# Patient Record
Sex: Female | Born: 1985 | Race: White | Hispanic: No | Marital: Single | Smoking: Never smoker
Health system: Southern US, Community
[De-identification: ages and names within clinical notes are randomized; demographics above are authoritative.]

---

## 2007-12-05 ENCOUNTER — Inpatient Hospital Stay (HOSPITAL_COMMUNITY): Admission: AD | Admit: 2007-12-05 | Discharge: 2007-12-06 | Payer: Self-pay | Admitting: Family Medicine

## 2008-12-14 IMAGING — US US TRANSVAGINAL NON-OB
1 series · 14 of 25 positions shown · non-contrast
Comparison: none

CLINICAL DATA: Possible pregnancy.  Last menstrual period 11/12/07.
 TRANSABDOMINAL AND TRANSVAGINAL PELVIC ULTRASOUND ? 12/06/07:
TECHNIQUE: Both transabdominal and transvaginal ultrasound examinations of the pelvis were performed including evaluation of the uterus, ovaries, adnexal regions, and pelvic cul-de-sac.

[Series 1: us transvaginal non-ob · 0.28mm/px · 14 of 35 slices shown]
[im 1/35]
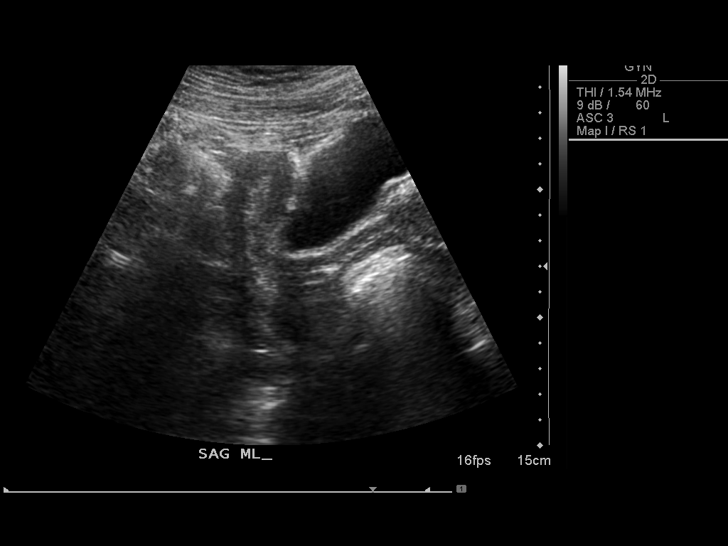
[im 3/35]
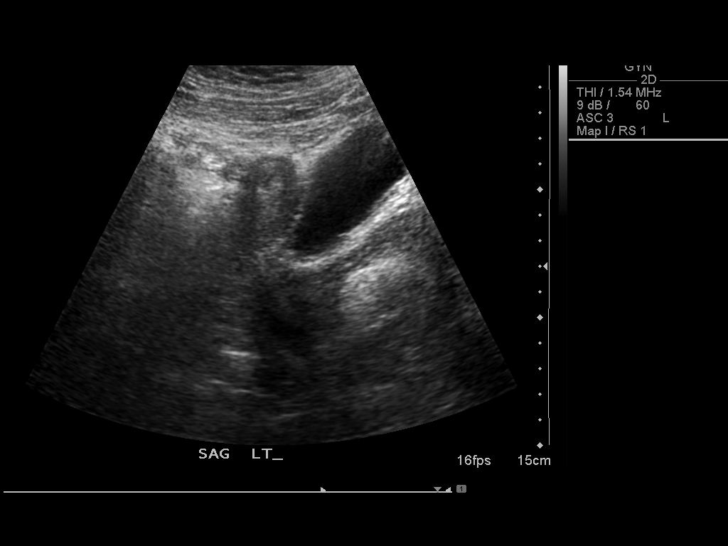
[im 6/35]
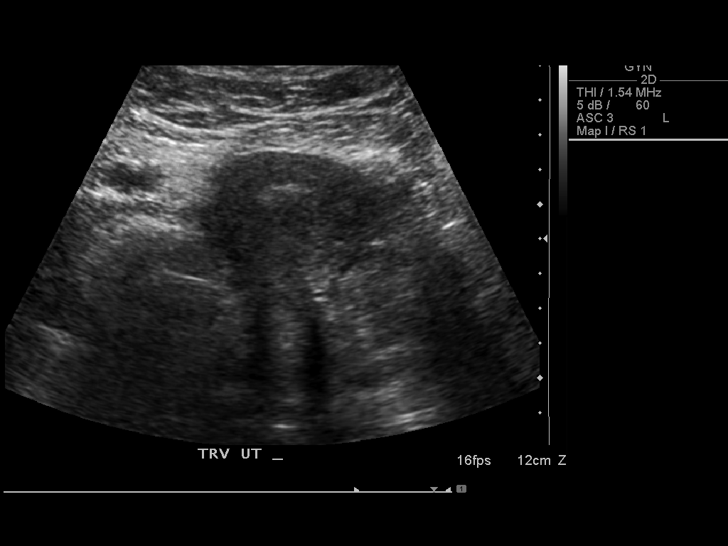
[im 9/35]
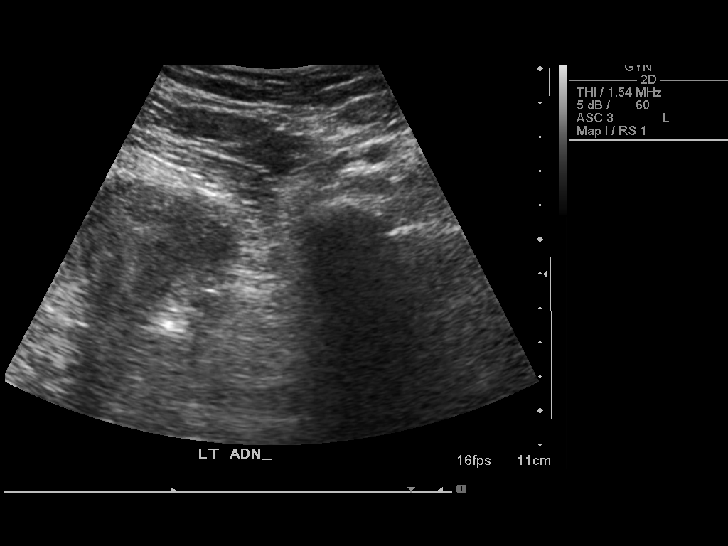
[im 12/35]
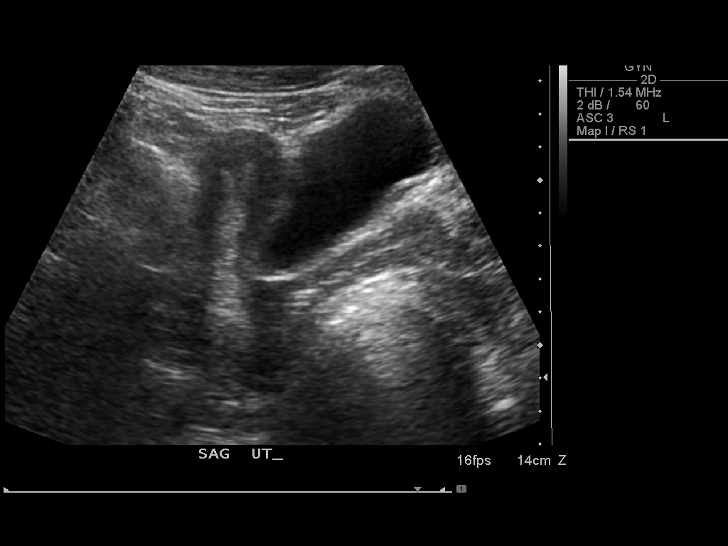
[im 13/35]
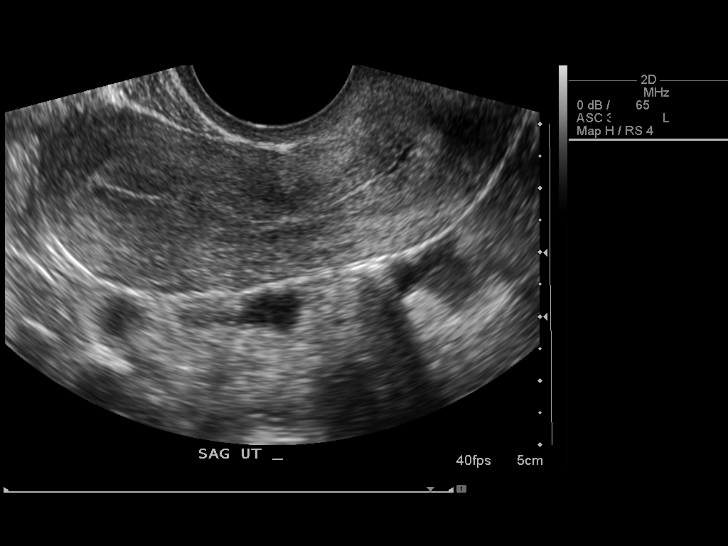
[im 16/35]
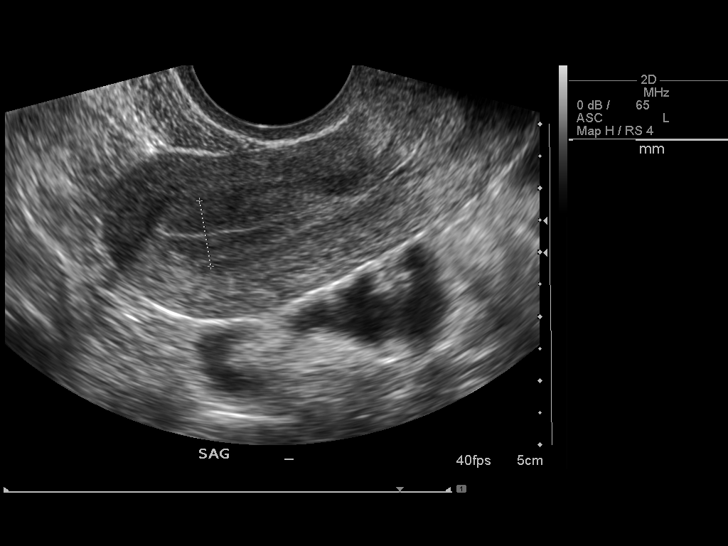
[im 19/35]
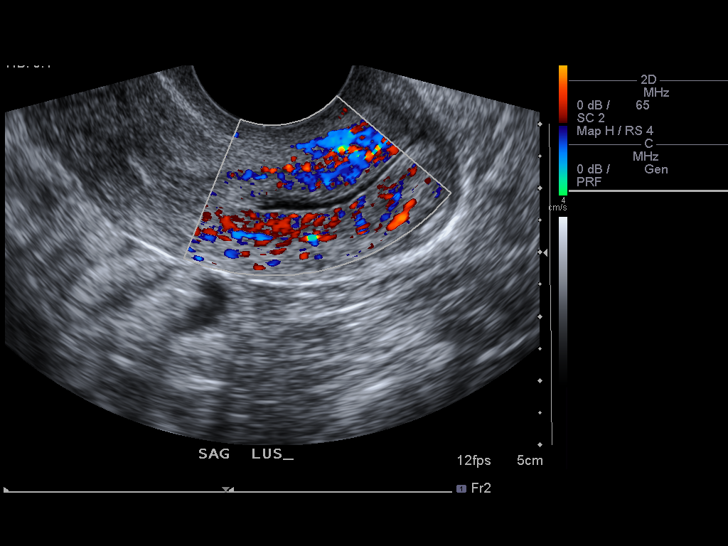
[im 22/35]
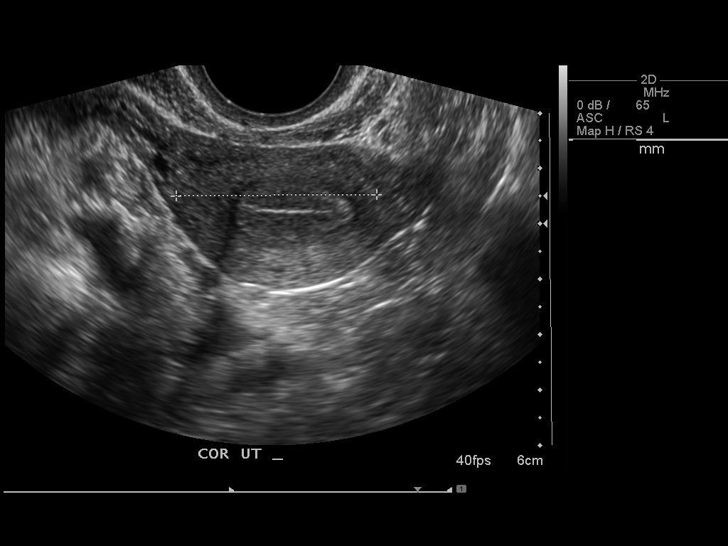
[im 23/35]
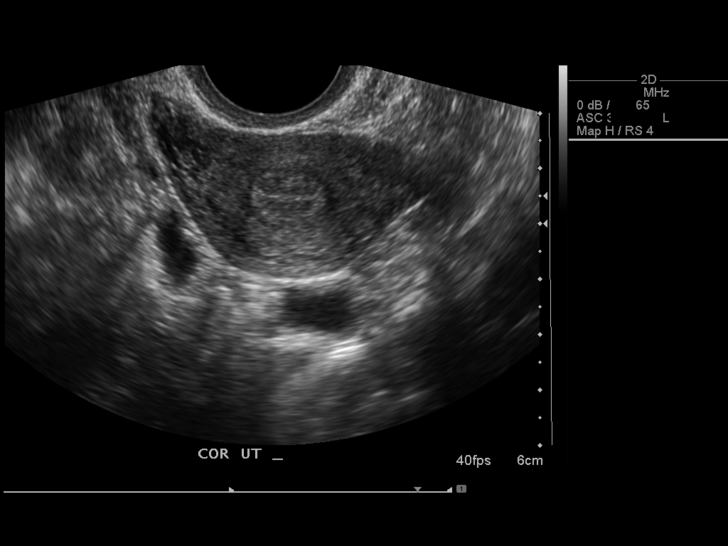
[im 26/35]
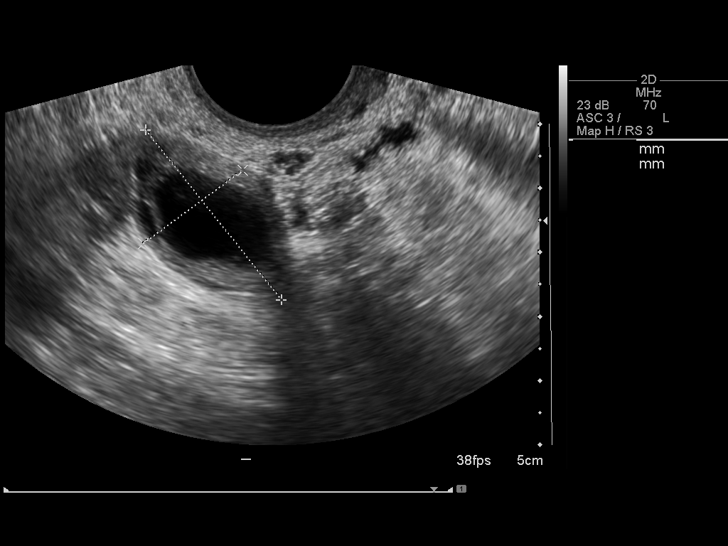
[im 29/35]
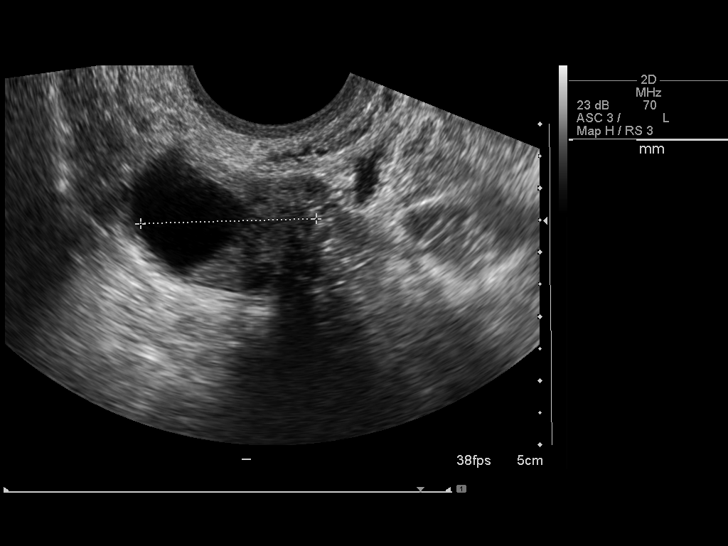
[im 32/35]
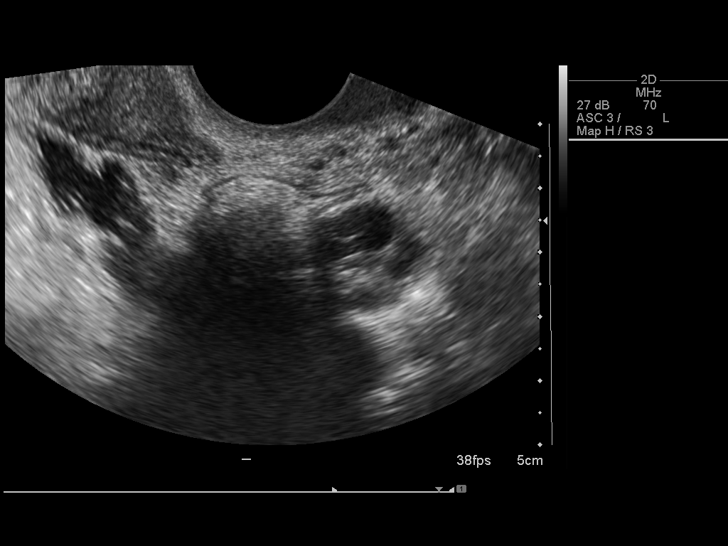
[im 35/35]
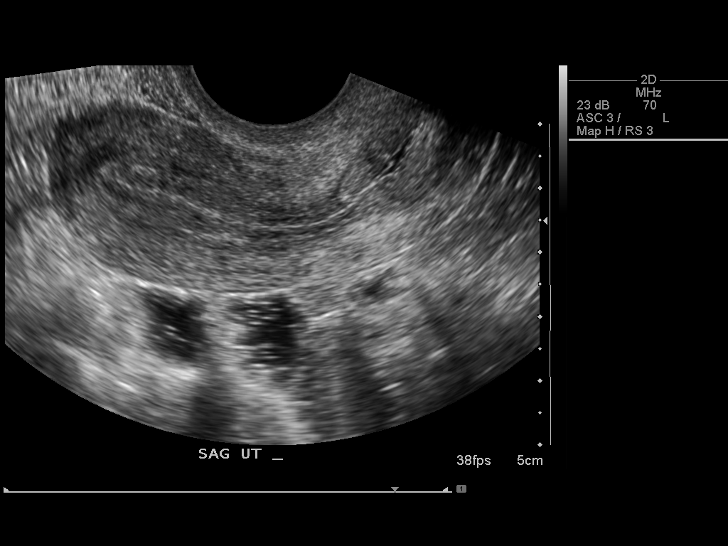

[14 of 25 positions shown; findings below may reference images not displayed]

FINDINGS: There is no evidence for an intrauterine pregnancy or gestational sac. There is a small amount of fluid in the cervix and lower uterine segment.  The uterus measures 7.1 x 2.6 x 3.6 cm.  The endometrial stripe has normal morphology measuring up to 1 cm.  The right ovary measures 3.4 x 1.9 x 2.7 cm. There is a dominant right ovarian follicle.  The left ovary measures 1.6 x 1.5 x 2.3 cm.  No significant free fluid.
IMPRESSION: 1.  No acute pelvic findings.
 2.  Small amount of fluid in the cervix and lower uterine segment.
 3.  Dominant right ovarian follicle.

## 2011-08-17 LAB — URINALYSIS, ROUTINE W REFLEX MICROSCOPIC
Bilirubin Urine: NEGATIVE
Glucose, UA: NEGATIVE
Nitrite: NEGATIVE
Specific Gravity, Urine: 1.015
pH: 5.5

## 2011-08-17 LAB — CBC
HCT: 39.2
Hemoglobin: 13.8
MCHC: 35.3
MCV: 85
RBC: 4.61

## 2011-08-17 LAB — WET PREP, GENITAL
Trich, Wet Prep: NONE SEEN
Yeast Wet Prep HPF POC: NONE SEEN

## 2011-08-17 LAB — POCT PREGNANCY, URINE

## 2019-12-05 ENCOUNTER — Ambulatory Visit (INDEPENDENT_AMBULATORY_CARE_PROVIDER_SITE_OTHER): Payer: Managed Care, Other (non HMO)

## 2019-12-05 ENCOUNTER — Encounter: Payer: Self-pay | Admitting: Orthopedic Surgery

## 2019-12-05 ENCOUNTER — Other Ambulatory Visit: Payer: Self-pay

## 2019-12-05 ENCOUNTER — Ambulatory Visit (INDEPENDENT_AMBULATORY_CARE_PROVIDER_SITE_OTHER): Payer: Managed Care, Other (non HMO) | Admitting: Orthopedic Surgery

## 2019-12-05 VITALS — Ht 67.5 in | Wt 290.0 lb

## 2019-12-05 DIAGNOSIS — M25561 Pain in right knee: Secondary | ICD-10-CM

## 2019-12-05 DIAGNOSIS — M1711 Unilateral primary osteoarthritis, right knee: Secondary | ICD-10-CM | POA: Diagnosis not present

## 2019-12-05 DIAGNOSIS — S83511A Sprain of anterior cruciate ligament of right knee, initial encounter: Secondary | ICD-10-CM
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
## 2019-12-31 ENCOUNTER — Other Ambulatory Visit: Payer: Self-pay

## 2019-12-31 ENCOUNTER — Ambulatory Visit
Admission: RE | Admit: 2019-12-31 | Discharge: 2019-12-31 | Disposition: A | Discharge status: Home / Self Care | Payer: BC Managed Care – PPO | Source: Ambulatory Visit | Attending: Orthopedic Surgery | Admitting: Orthopedic Surgery

## 2019-12-31 DIAGNOSIS — M25561 Pain in right knee: Secondary | ICD-10-CM

## 2020-01-02 ENCOUNTER — Other Ambulatory Visit: Payer: Self-pay

## 2020-01-02 ENCOUNTER — Encounter: Payer: Self-pay | Admitting: Orthopedic Surgery

## 2020-01-02 ENCOUNTER — Ambulatory Visit: Payer: BC Managed Care – PPO | Admitting: Orthopedic Surgery

## 2020-01-02 DIAGNOSIS — M1711 Unilateral primary osteoarthritis, right knee: Secondary | ICD-10-CM | POA: Diagnosis not present
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
## 2020-01-02 NOTE — Progress Notes (Signed)
Mailed today

## 2021-01-08 IMAGING — MR MR KNEE*R* W/O CM
4 of 8 series · 19 of 40 positions shown · non-contrast
Comparison: Radiographs 12/05/2019

CLINICAL DATA: Knee instability. Medial knee pain.

EXAM:
MRI OF THE RIGHT KNEE WITHOUT CONTRAST
TECHNIQUE: Multiplanar, multisequence MR imaging of the knee was performed
using the JYOTHI protocol. No intravenous contrast was administered.

[Series 6: T2 fat-sat · axial · right · 4.0mm · 0.53mm/px · z∈[-34,+119]mm · 6 of 36 slices shown (1 of 3)]
[im 1/36]
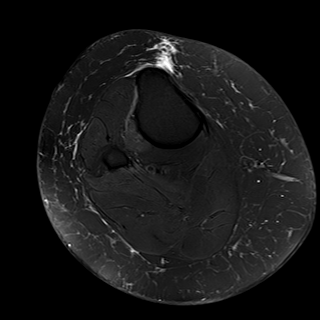
[im 8/36]
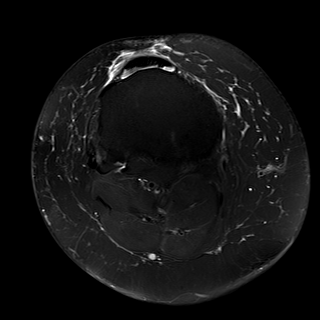
[im 15/36]
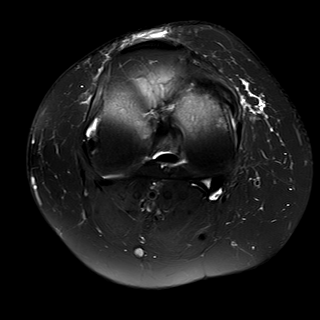
[im 22/36]
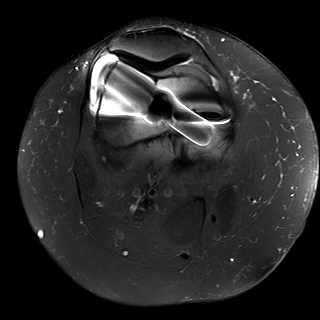
[im 29/36]
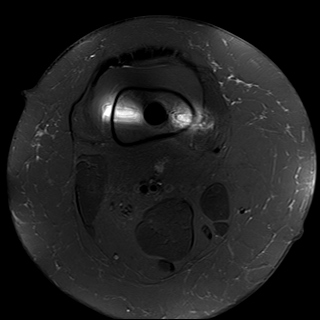
[im 36/36]
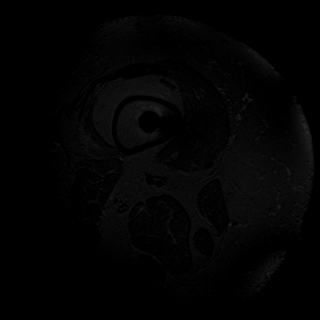

[Series 8: T2 fat-sat · axial · right · 4.0mm · 0.25mm/px · z∈[-47,+92]mm · 5 of 30 slices shown (2 of 3)]
[im 1/30]
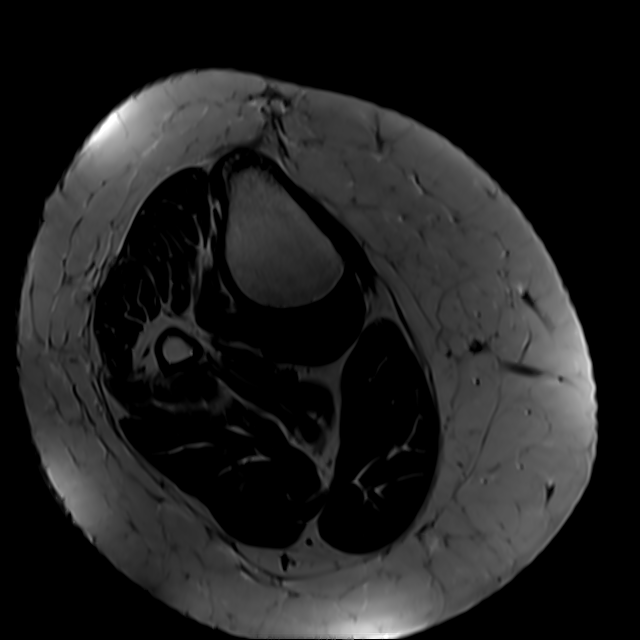
[im 8/30]
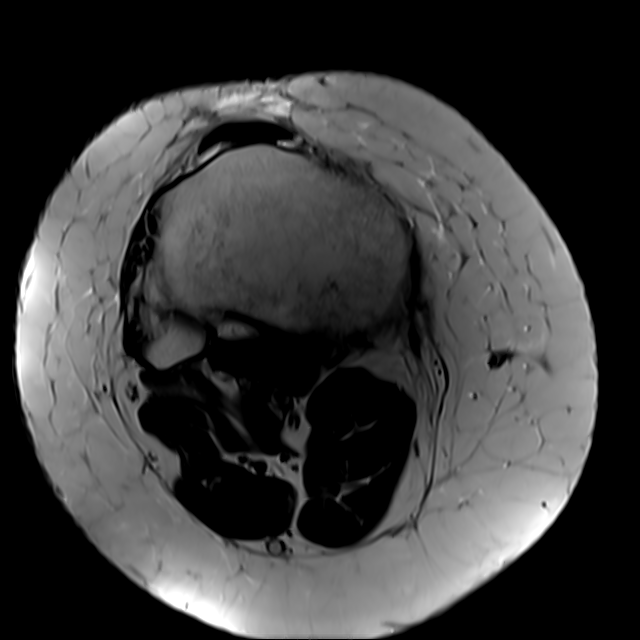
[im 15/30]
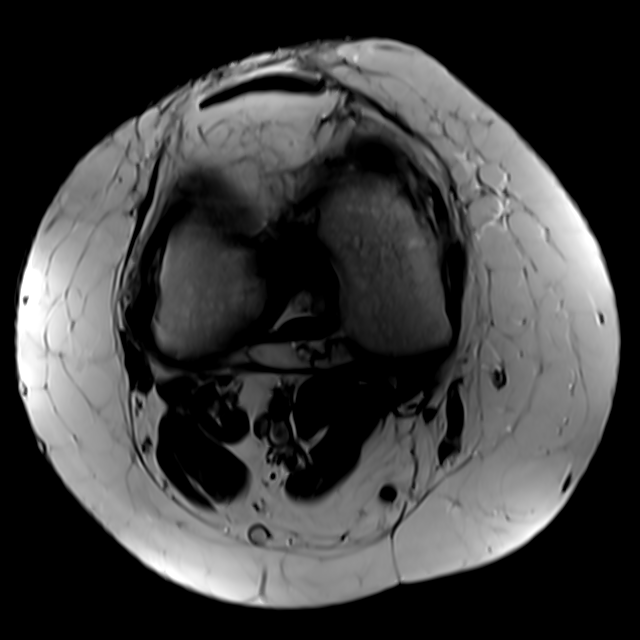
[im 22/30]
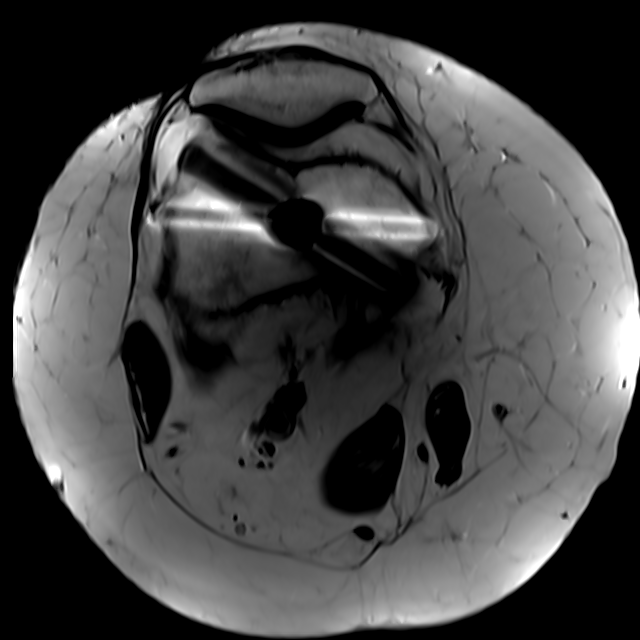
[im 30/30]
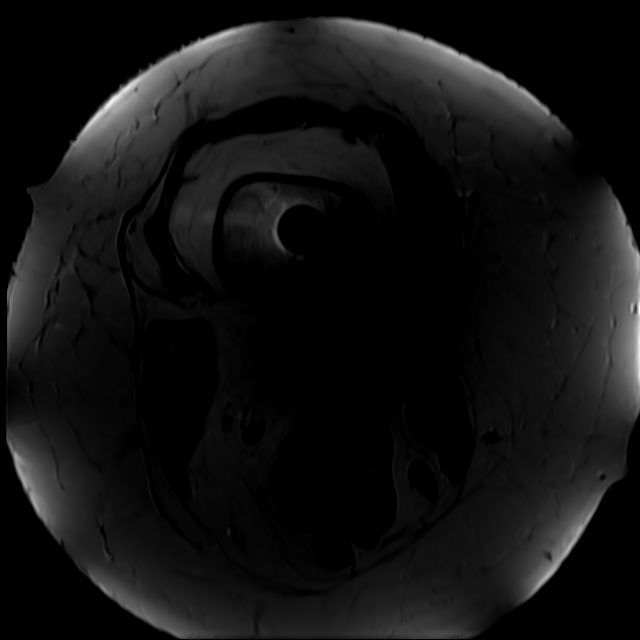

[Series 12: T2 fat-sat · sagittal · right · 4.0mm · 0.25mm/px · 4 of 30 slices shown (3 of 3)]
[im 1/30]
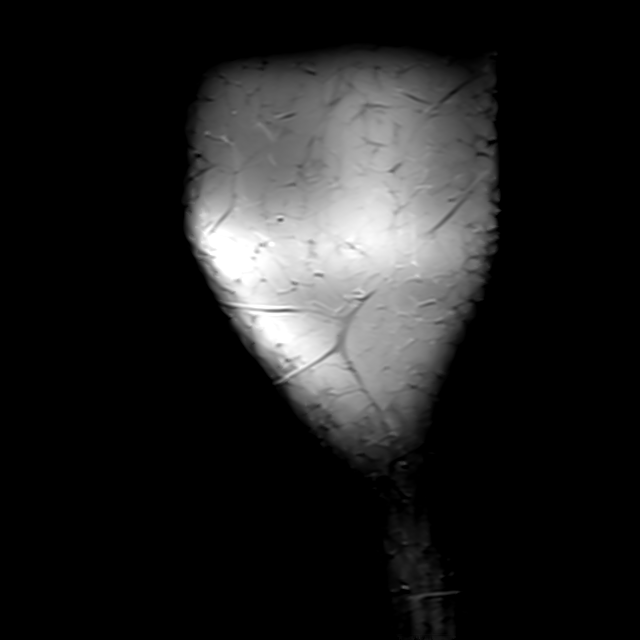
[im 8/30]
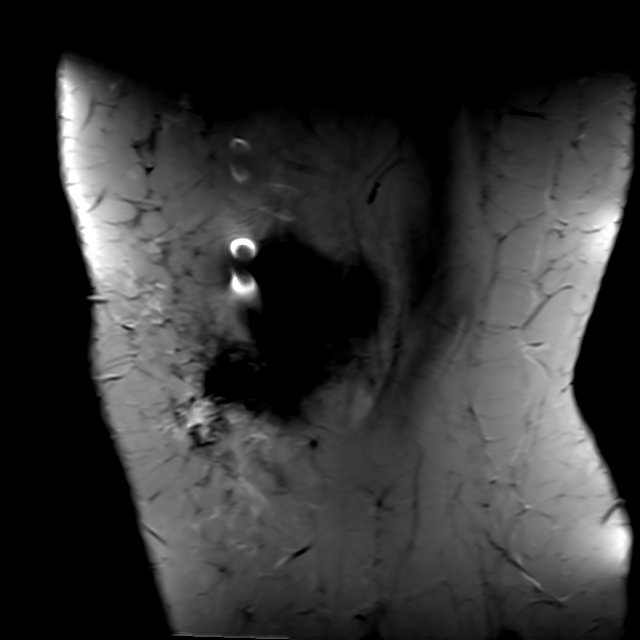
[im 15/30]
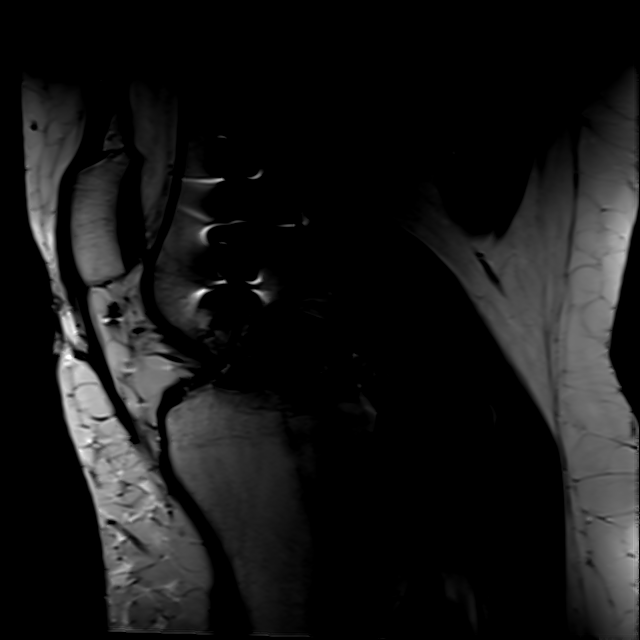
[im 30/30]
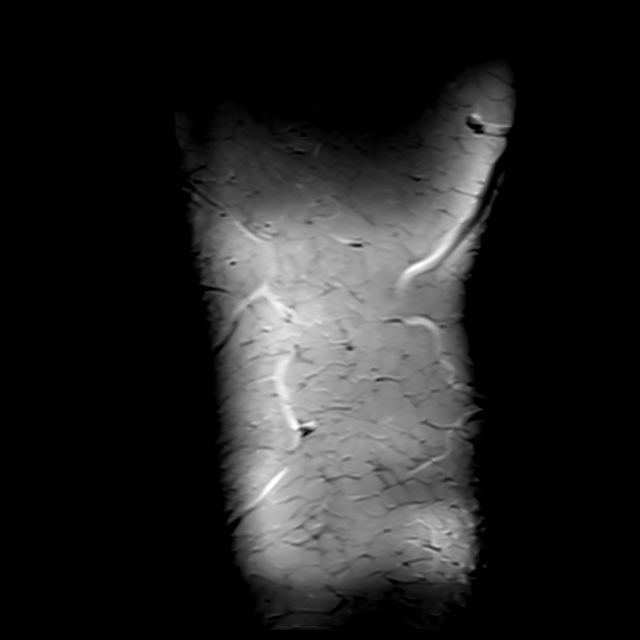

[Series 13: PD · oblique · right · 1.5mm · 0.44mm/px · 4 of 21 slices shown]
[im 1/21]
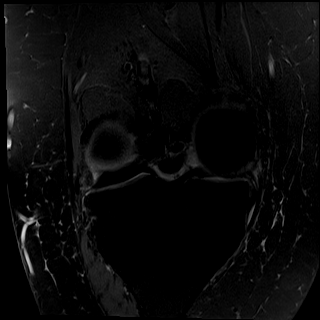
[im 7/21]
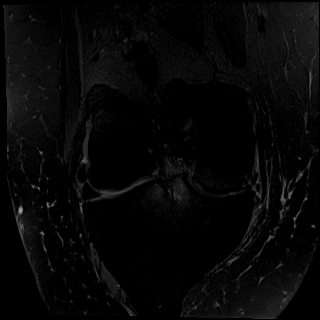
[im 14/21]
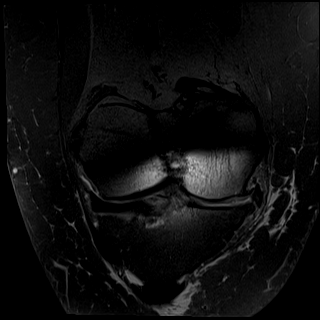
[im 21/21]
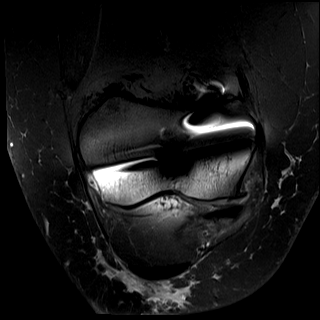

[19 of 40 positions shown; findings below may reference images not displayed]

FINDINGS: Metal artifact from femoral nail with interlocking screws.

MENISCI

Medial meniscus: Severely diminutive and attenuated midbody of the
medial meniscus with only a very small band of residual meniscal
tissue along the midbody and adjacent posterior horn. Because of
limitations related to the JYOTHI protocol, the medial meniscus is
difficult to further characterize. If the patient has a history of
partial meniscectomy then this may explain the diminutive size
although I expect that there is probably also additional
degenerative tearing along the midbody and posterior horn. In the
absence of prior partial medial meniscectomy, this represents a
large meniscal tear.

Lateral meniscus:  Unremarkable

LIGAMENTS

Cruciates: The ACL is somewhat indistinct proximally but skeptical
that it is completely torn. The PCL appears intact.

Collaterals:  Unremarkable

CARTILAGE

Patellofemoral:  Unremarkable

Medial: Moderate to severe degenerative chondral thinning with
chondral heterogeneity and irregularity especially medially. Small
subcortical foci of edema along the medial margin of the medial
distal femur. Considerable marginal spurring in the medial
compartment, disproportionate to the other compartments.

Lateral:  Mild marginal spurring.

Joint: Trace knee effusion. Mild low signal intensity stranding
medially in Hoffa's fat pad suggesting low-grade fibrosis. Possible
0.5 by 0.3 cm free osteochondral fragment just posterior to the PCL
on image [DATE].

Popliteal Fossa:  Small Baker's cyst. Mild pes anserine bursitis.

Extensor Mechanism:  Unremarkable

Bones: No significant extra-articular osseous abnormalities
identified.

Other: Prepatellar subcutaneous edema.
IMPRESSION: 1. Severely diminutive and attenuated midbody of the medial meniscus
with only a very small band of residual meniscal tissue along the
midbody and adjacent posterior horn. In the absence of prior partial
medial meniscectomy, this represents a large meniscal tear. If the
patient has had partial meniscectomy in the past, then there is
likely degeneration but without a well-defined new tear. Please note
that sensitivity is reduced due to the JYOTHI protocol, which has only
limited proton density weighted imaging.
2. Moderate to severe degenerative chondral thinning in the medial
compartment, with chondral surface irregularity medially. Mild
marginal spurring in the lateral compartment.
3. Trace knee effusion with small Baker's cyst and mild pes anserine
bursitis.
4. Possible 5 by 3 mm free osteochondral fragment posterior to the
PCL.
5. Suspected low-grade fibrosis medially in Hoffa's fat pad.
